# Patient Record
Sex: Female | Born: 1990 | Race: White | Hispanic: No | Marital: Single | Smoking: Never smoker
Health system: Southern US, Community
[De-identification: ages and names within clinical notes are randomized; demographics above are authoritative.]

## PROBLEM LIST (undated history)

## (undated) DIAGNOSIS — L74510 Primary focal hyperhidrosis, axilla: Secondary | ICD-10-CM

## (undated) DIAGNOSIS — T4145XA Adverse effect of unspecified anesthetic, initial encounter: Secondary | ICD-10-CM

## (undated) DIAGNOSIS — R0981 Nasal congestion: Secondary | ICD-10-CM

## (undated) DIAGNOSIS — F458 Other somatoform disorders: Secondary | ICD-10-CM

## (undated) DIAGNOSIS — S82851A Displaced trimalleolar fracture of right lower leg, initial encounter for closed fracture: Secondary | ICD-10-CM

## (undated) DIAGNOSIS — L709 Acne, unspecified: Secondary | ICD-10-CM

## (undated) DIAGNOSIS — F410 Panic disorder [episodic paroxysmal anxiety] without agoraphobia: Secondary | ICD-10-CM

## (undated) DIAGNOSIS — T8859XA Other complications of anesthesia, initial encounter: Secondary | ICD-10-CM

## (undated) DIAGNOSIS — M26629 Arthralgia of temporomandibular joint, unspecified side: Secondary | ICD-10-CM

## (undated) DIAGNOSIS — Z87828 Personal history of other (healed) physical injury and trauma: Secondary | ICD-10-CM

## (undated) DIAGNOSIS — S93431A Sprain of tibiofibular ligament of right ankle, initial encounter: Secondary | ICD-10-CM

## (undated) HISTORY — PX: FRACTURE SURGERY: SHX138

## (undated) HISTORY — PX: WISDOM TOOTH EXTRACTION: SHX21

---
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
## 2009-06-18 ENCOUNTER — Encounter: Admission: RE | Admit: 2009-06-18 | Discharge: 2009-06-18 | Payer: Self-pay | Admitting: Family Medicine

## 2013-11-07 DIAGNOSIS — S93431A Sprain of tibiofibular ligament of right ankle, initial encounter: Secondary | ICD-10-CM

## 2013-11-07 DIAGNOSIS — S82851A Displaced trimalleolar fracture of right lower leg, initial encounter for closed fracture: Secondary | ICD-10-CM

## 2013-11-07 HISTORY — DX: Displaced trimalleolar fracture of right lower leg, initial encounter for closed fracture: S82.851A

## 2013-11-07 HISTORY — DX: Sprain of tibiofibular ligament of right ankle, initial encounter: S93.431A

## 2013-11-14 ENCOUNTER — Encounter (HOSPITAL_BASED_OUTPATIENT_CLINIC_OR_DEPARTMENT_OTHER): Payer: Self-pay | Admitting: *Deleted

## 2013-11-14 DIAGNOSIS — R0981 Nasal congestion: Secondary | ICD-10-CM

## 2013-11-14 HISTORY — DX: Nasal congestion: R09.81

## 2013-11-16 ENCOUNTER — Other Ambulatory Visit: Payer: Self-pay | Admitting: Orthopedic Surgery

## 2013-11-16 NOTE — H&P (Signed)
[name] is an 22 y.o. female.   Chief Complaint: Right ankle pain HPI: Pt reports to surgery center for ORIF of right trimalleolar ankle fracture and syndesmosis disruption.  On 11/10/2013 pt slipped on wet curb and twisted right ankle causing pain and a loud popping sound.  Pt denies N/V/F/C, chest pain, SOB, paresthesia bilaterally.  Past Medical History  Diagnosis Date  . Panic attacks     about 1/month  . Complication of anesthesia     had panic attack while coming out of anesthesia with wisdom teeth extraction  . Hyperhidrosis of axilla   . Nasal congestion 11/14/2013  . History of back injury   . Trimalleolar fracture of right ankle 11/2013  . Syndesmotic disruption of right ankle 11/2013  . TMJ syndrome   . Grinding of teeth     wears mouth guard at night  . Acne     Past Surgical History  Procedure Laterality Date  . Wisdom tooth extraction      History reviewed. No pertinent family history. Social History:  reports that she has never smoked. She has never used smokeless tobacco. She reports that she drinks alcohol. She reports that she does not use illicit drugs.  Allergies:  Allergies  Allergen Reactions  . Novocain [Procaine] Swelling    SWELLING OF FACE - AS A CHILD    No prescriptions prior to admission    No results found for this or any previous visit (from the past 48 hour(s)). No results found.  Review of Systems  Constitutional: Negative.   HENT: Negative.   Eyes: Negative.   Respiratory: Negative.   Cardiovascular: Negative.   Gastrointestinal: Negative.   Musculoskeletal: Negative.   Skin: Negative.   Neurological: Negative for dizziness.  Endo/Heme/Allergies: Does not bruise/bleed easily.  Psychiatric/Behavioral: The patient is not nervous/anxious.     Height 5' 7.5" (1.715 m), weight 123.378 kg (272 lb), last menstrual period 11/06/2013. Physical Exam WD WN 22 y/o female, NAD, A/Ox3, appears stated age.  EOMI, mood and affect  normal, respirations unlabored. Edema laterally and medially, skin healthy and intact. DP pulses 2+ bilaterally. Sensation to light touch intact bilaterally.  Toes mobile and well perfused with cap refill <2sec.  No lymphadenopathy noted.  Assessment/Plan Right trimalleolar ankle fracture and syndesmosis disruption. Plan: ORIF right ankle fracture.  The risks and benefits of the alternative treatment options have been discussed in detail.  The patient wishes to proceed with surgery and specifically understands risks of bleeding, infection, nerve damage, blood clots, need for additional surgery, amputation and death.   FLOWERS, CHRISTOPHER S 11/16/2013, 5:31 PM  Agree with the above note.  Pt understands the plan and agrees.

## 2013-11-17 ENCOUNTER — Ambulatory Visit (HOSPITAL_COMMUNITY): Payer: 59

## 2013-11-17 ENCOUNTER — Encounter (HOSPITAL_BASED_OUTPATIENT_CLINIC_OR_DEPARTMENT_OTHER)
Admission: RE | Disposition: A | Discharge status: Home / Self Care | Payer: Self-pay | Source: Ambulatory Visit | Attending: Orthopedic Surgery

## 2013-11-17 ENCOUNTER — Encounter (HOSPITAL_BASED_OUTPATIENT_CLINIC_OR_DEPARTMENT_OTHER): Payer: Self-pay | Admitting: *Deleted

## 2013-11-17 ENCOUNTER — Ambulatory Visit (HOSPITAL_BASED_OUTPATIENT_CLINIC_OR_DEPARTMENT_OTHER): Payer: 59 | Admitting: Anesthesiology

## 2013-11-17 ENCOUNTER — Encounter (HOSPITAL_BASED_OUTPATIENT_CLINIC_OR_DEPARTMENT_OTHER): Payer: 59 | Admitting: Anesthesiology

## 2013-11-17 ENCOUNTER — Ambulatory Visit (HOSPITAL_BASED_OUTPATIENT_CLINIC_OR_DEPARTMENT_OTHER)
Admission: RE | Admit: 2013-11-17 | Discharge: 2013-11-17 | Disposition: A | Discharge status: Home / Self Care | Payer: 59 | Source: Ambulatory Visit | Attending: Orthopedic Surgery | Admitting: Orthopedic Surgery

## 2013-11-17 DIAGNOSIS — M26609 Unspecified temporomandibular joint disorder, unspecified side: Secondary | ICD-10-CM | POA: Insufficient documentation

## 2013-11-17 DIAGNOSIS — X500XXA Overexertion from strenuous movement or load, initial encounter: Secondary | ICD-10-CM | POA: Insufficient documentation

## 2013-11-17 DIAGNOSIS — Y92410 Unspecified street and highway as the place of occurrence of the external cause: Secondary | ICD-10-CM | POA: Insufficient documentation

## 2013-11-17 DIAGNOSIS — Y998 Other external cause status: Secondary | ICD-10-CM | POA: Insufficient documentation

## 2013-11-17 DIAGNOSIS — F410 Panic disorder [episodic paroxysmal anxiety] without agoraphobia: Secondary | ICD-10-CM | POA: Insufficient documentation

## 2013-11-17 DIAGNOSIS — S82853A Displaced trimalleolar fracture of unspecified lower leg, initial encounter for closed fracture: Secondary | ICD-10-CM | POA: Insufficient documentation

## 2013-11-17 DIAGNOSIS — L708 Other acne: Secondary | ICD-10-CM | POA: Insufficient documentation

## 2013-11-17 DIAGNOSIS — S82851A Displaced trimalleolar fracture of right lower leg, initial encounter for closed fracture: Secondary | ICD-10-CM

## 2013-11-17 HISTORY — DX: Panic disorder (episodic paroxysmal anxiety): F41.0

## 2013-11-17 HISTORY — PX: ORIF ANKLE FRACTURE: SHX5408

## 2013-11-17 HISTORY — DX: Nasal congestion: R09.81

## 2013-11-17 HISTORY — DX: Adverse effect of unspecified anesthetic, initial encounter: T41.45XA

## 2013-11-17 HISTORY — DX: Arthralgia of temporomandibular joint, unspecified side: M26.629

## 2013-11-17 HISTORY — DX: Personal history of other (healed) physical injury and trauma: Z87.828

## 2013-11-17 HISTORY — DX: Acne, unspecified: L70.9

## 2013-11-17 HISTORY — DX: Sprain of tibiofibular ligament of right ankle, initial encounter: S93.431A

## 2013-11-17 HISTORY — DX: Other somatoform disorders: F45.8

## 2013-11-17 HISTORY — DX: Displaced trimalleolar fracture of right lower leg, initial encounter for closed fracture: S82.851A

## 2013-11-17 HISTORY — DX: Primary focal hyperhidrosis, axilla: L74.510

## 2013-11-17 HISTORY — DX: Other complications of anesthesia, initial encounter: T88.59XA

## 2013-11-17 LAB — POCT HEMOGLOBIN-HEMACUE: Hemoglobin: 13.7 g/dL (ref 12.0–15.0)

## 2013-11-17 SURGERY — OPEN REDUCTION INTERNAL FIXATION (ORIF) ANKLE FRACTURE
Anesthesia: General | Site: Ankle | Laterality: Right

## 2013-11-17 MED ORDER — HYDROMORPHONE HCL PF 1 MG/ML IJ SOLN
INTRAMUSCULAR | Status: AC
  Filled 2013-11-17: qty 1

## 2013-11-17 MED ORDER — FENTANYL CITRATE 0.05 MG/ML IJ SOLN
INTRAMUSCULAR | Status: AC
  Filled 2013-11-17: qty 2

## 2013-11-17 MED ORDER — ACETAMINOPHEN 500 MG PO TABS
1000.0000 mg | ORAL_TABLET | Freq: Once | ORAL | Status: AC
  Administered 2013-11-17: 1000 mg via ORAL

## 2013-11-17 MED ORDER — 0.9 % SODIUM CHLORIDE (POUR BTL) OPTIME
TOPICAL | Status: DC | PRN
  Administered 2013-11-17: 300 mL

## 2013-11-17 MED ORDER — LACTATED RINGERS IV SOLN: INTRAVENOUS | Status: DC

## 2013-11-17 MED ORDER — MIDAZOLAM HCL 2 MG/2ML IJ SOLN
INTRAMUSCULAR | Status: AC
  Filled 2013-11-17: qty 2

## 2013-11-17 MED ORDER — BUPIVACAINE-EPINEPHRINE PF 0.5-1:200000 % IJ SOLN
INTRAMUSCULAR | Status: DC | PRN
  Administered 2013-11-17: 150 mg via PERINEURAL

## 2013-11-17 MED ORDER — ONDANSETRON HCL 4 MG/2ML IJ SOLN
INTRAMUSCULAR | Status: DC | PRN
  Administered 2013-11-17: 4 mg via INTRAVENOUS

## 2013-11-17 MED ORDER — OXYCODONE HCL 5 MG PO TABS
5.0000 mg | ORAL_TABLET | Freq: Once | ORAL | Status: DC | PRN
Start: 2013-11-17 — End: 2013-11-17

## 2013-11-17 MED ORDER — PROPOFOL 10 MG/ML IV BOLUS
INTRAVENOUS | Status: DC | PRN
  Administered 2013-11-17: 200 mg via INTRAVENOUS

## 2013-11-17 MED ORDER — DOCUSATE SODIUM 100 MG PO CAPS: 100.0000 mg | ORAL_CAPSULE | Freq: Two times a day (BID) | ORAL | Status: DC

## 2013-11-17 MED ORDER — ACETAMINOPHEN 500 MG PO TABS
ORAL_TABLET | ORAL | Status: AC
  Filled 2013-11-17: qty 2

## 2013-11-17 MED ORDER — OXYCODONE HCL 5 MG/5ML PO SOLN: 5.0000 mg | Freq: Once | ORAL | Status: DC | PRN

## 2013-11-17 MED ORDER — LACTATED RINGERS IV SOLN
INTRAVENOUS | Status: DC
  Administered 2013-11-17 (×2): via INTRAVENOUS

## 2013-11-17 MED ORDER — BUPIVACAINE-EPINEPHRINE 0.5% -1:200000 IJ SOLN
INTRAMUSCULAR | Status: DC | PRN
  Administered 2013-11-17: 30 mL

## 2013-11-17 MED ORDER — FENTANYL CITRATE 0.05 MG/ML IJ SOLN
50.0000 ug | INTRAMUSCULAR | Status: DC | PRN
  Administered 2013-11-17: 100 ug via INTRAVENOUS

## 2013-11-17 MED ORDER — LIDOCAINE HCL (CARDIAC) 20 MG/ML IV SOLN
INTRAVENOUS | Status: DC | PRN
  Administered 2013-11-17: 30 mg via INTRAVENOUS

## 2013-11-17 MED ORDER — MIDAZOLAM HCL 2 MG/2ML IJ SOLN
1.0000 mg | INTRAMUSCULAR | Status: DC | PRN
  Administered 2013-11-17: 4 mg via INTRAVENOUS

## 2013-11-17 MED ORDER — HYDROMORPHONE HCL PF 1 MG/ML IJ SOLN
0.2500 mg | INTRAMUSCULAR | Status: DC | PRN
  Administered 2013-11-17 (×3): 0.5 mg via INTRAVENOUS

## 2013-11-17 MED ORDER — CEFAZOLIN SODIUM-DEXTROSE 2-3 GM-% IV SOLR
INTRAVENOUS | Status: AC
  Filled 2013-11-17: qty 50

## 2013-11-17 MED ORDER — DEXAMETHASONE SODIUM PHOSPHATE 10 MG/ML IJ SOLN
INTRAMUSCULAR | Status: DC | PRN
  Administered 2013-11-17: 6 mg
  Administered 2013-11-17: 10 mg via INTRAVENOUS

## 2013-11-17 MED ORDER — BUPIVACAINE-EPINEPHRINE PF 0.5-1:200000 % IJ SOLN
INTRAMUSCULAR | Status: AC
  Filled 2013-11-17: qty 30

## 2013-11-17 MED ORDER — FENTANYL CITRATE 0.05 MG/ML IJ SOLN
INTRAMUSCULAR | Status: DC | PRN
  Administered 2013-11-17 (×3): 50 ug via INTRAVENOUS

## 2013-11-17 MED ORDER — BACITRACIN ZINC 500 UNIT/GM EX OINT
TOPICAL_OINTMENT | CUTANEOUS | Status: DC | PRN
  Administered 2013-11-17: 1 via TOPICAL

## 2013-11-17 MED ORDER — MIDAZOLAM HCL 2 MG/ML PO SYRP: 12.0000 mg | ORAL_SOLUTION | Freq: Once | ORAL | Status: DC | PRN

## 2013-11-17 MED ORDER — MIDAZOLAM HCL 5 MG/5ML IJ SOLN
INTRAMUSCULAR | Status: DC | PRN
  Administered 2013-11-17: 2 mg via INTRAVENOUS

## 2013-11-17 MED ORDER — FENTANYL CITRATE 0.05 MG/ML IJ SOLN
INTRAMUSCULAR | Status: AC
  Filled 2013-11-17: qty 6

## 2013-11-17 MED ORDER — SENNOSIDES 8.6 MG PO TABS: 2.0000 | ORAL_TABLET | Freq: Every day | ORAL | Status: DC

## 2013-11-17 MED ORDER — PROMETHAZINE HCL 25 MG/ML IJ SOLN: 6.2500 mg | INTRAMUSCULAR | Status: DC | PRN

## 2013-11-17 MED ORDER — RIVAROXABAN 10 MG PO TABS: 10.0000 mg | ORAL_TABLET | Freq: Every day | ORAL | Status: DC

## 2013-11-17 MED ORDER — OXYCODONE HCL 5 MG PO TABS: 5.0000 mg | ORAL_TABLET | ORAL | Status: DC | PRN

## 2013-11-17 MED ORDER — CHLORHEXIDINE GLUCONATE 4 % EX LIQD: 60.0000 mL | Freq: Once | CUTANEOUS | Status: DC

## 2013-11-17 MED ORDER — CEFAZOLIN SODIUM-DEXTROSE 2-3 GM-% IV SOLR
2.0000 g | INTRAVENOUS | Status: AC
  Administered 2013-11-17: 2 g via INTRAVENOUS

## 2013-11-17 MED ORDER — SODIUM CHLORIDE 0.9 % IV SOLN: INTRAVENOUS | Status: DC

## 2013-11-17 SURGICAL SUPPLY — 90 items
BANDAGE ESMARK 6X9 LF (GAUZE/BANDAGES/DRESSINGS) ×1 IMPLANT
BIT DRILL 2.0 (BIT) ×2
BIT DRILL 2.5X2.75 QC CALB (BIT) ×1 IMPLANT
BIT DRILL 2XNS DISP SS SM FRAG (BIT) IMPLANT
BIT DRILL 3.5X5.5 QC CALB (BIT) ×1 IMPLANT
BIT DRILL CALIBRATED 2.7 (BIT) ×1 IMPLANT
BIT DRL 2XNS DISP SS SM FRAG (BIT) ×1
BLADE SURG 15 STRL LF DISP TIS (BLADE) ×2 IMPLANT
BLADE SURG 15 STRL SS (BLADE) ×4
BNDG CMPR 9X4 STRL LF SNTH (GAUZE/BANDAGES/DRESSINGS)
BNDG CMPR 9X6 STRL LF SNTH (GAUZE/BANDAGES/DRESSINGS) ×1
BNDG COHESIVE 4X5 TAN STRL (GAUZE/BANDAGES/DRESSINGS) ×2 IMPLANT
BNDG COHESIVE 6X5 TAN STRL LF (GAUZE/BANDAGES/DRESSINGS) ×2 IMPLANT
BNDG ESMARK 4X9 LF (GAUZE/BANDAGES/DRESSINGS) IMPLANT
BNDG ESMARK 6X9 LF (GAUZE/BANDAGES/DRESSINGS) ×2
CANISTER SUCT 1200ML W/VALVE (MISCELLANEOUS) ×2 IMPLANT
CHLORAPREP W/TINT 26ML (MISCELLANEOUS) ×2 IMPLANT
COVER TABLE BACK 60X90 (DRAPES) ×2 IMPLANT
CUFF TOURNIQUET SINGLE 34IN LL (TOURNIQUET CUFF) ×1 IMPLANT
CUFF TOURNIQUET SINGLE 44IN (TOURNIQUET CUFF) ×1 IMPLANT
DECANTER SPIKE VIAL GLASS SM (MISCELLANEOUS) IMPLANT
DRAPE C-ARM 42X72 X-RAY (DRAPES) ×2 IMPLANT
DRAPE C-ARMOR (DRAPES) ×1 IMPLANT
DRAPE EXTREMITY T 121X128X90 (DRAPE) ×2 IMPLANT
DRAPE U-SHAPE 47X51 STRL (DRAPES) ×2 IMPLANT
DRSG ADAPTIC 3X8 NADH LF (GAUZE/BANDAGES/DRESSINGS) ×1 IMPLANT
DRSG EMULSION OIL 3X3 NADH (GAUZE/BANDAGES/DRESSINGS) ×1 IMPLANT
ELECT REM PT RETURN 9FT ADLT (ELECTROSURGICAL) ×2
ELECTRODE REM PT RTRN 9FT ADLT (ELECTROSURGICAL) ×1 IMPLANT
GLOVE BIO SURGEON STRL SZ8 (GLOVE) ×3 IMPLANT
GLOVE BIOGEL PI IND STRL 7.0 (GLOVE) IMPLANT
GLOVE BIOGEL PI IND STRL 7.5 (GLOVE) ×1 IMPLANT
GLOVE BIOGEL PI IND STRL 8 (GLOVE) ×1 IMPLANT
GLOVE BIOGEL PI INDICATOR 7.0 (GLOVE) ×1
GLOVE BIOGEL PI INDICATOR 7.5 (GLOVE) ×1
GLOVE BIOGEL PI INDICATOR 8 (GLOVE) ×1
GLOVE ECLIPSE 6.5 STRL STRAW (GLOVE) ×1 IMPLANT
GLOVE ECLIPSE 7.0 STRL STRAW (GLOVE) ×2 IMPLANT
GLOVE EXAM NITRILE MD LF STRL (GLOVE) ×1 IMPLANT
GOWN PREVENTION PLUS XLARGE (GOWN DISPOSABLE) ×3 IMPLANT
GOWN PREVENTION PLUS XXLARGE (GOWN DISPOSABLE) ×2 IMPLANT
K-WIRE ACE 1.6X6 (WIRE) ×4
KWIRE ACE 1.6X6 (WIRE) IMPLANT
NEEDLE HYPO 22GX1.5 SAFETY (NEEDLE) IMPLANT
NS IRRIG 1000ML POUR BTL (IV SOLUTION) ×2 IMPLANT
PACK BASIN DAY SURGERY FS (CUSTOM PROCEDURE TRAY) ×2 IMPLANT
PAD ABD 8X10 STRL (GAUZE/BANDAGES/DRESSINGS) ×4 IMPLANT
PAD CAST 4YDX4 CTTN HI CHSV (CAST SUPPLIES) ×1 IMPLANT
PADDING CAST ABS 4INX4YD NS (CAST SUPPLIES)
PADDING CAST ABS COTTON 4X4 ST (CAST SUPPLIES) IMPLANT
PADDING CAST COTTON 4X4 STRL (CAST SUPPLIES) ×2
PADDING CAST COTTON 6X4 STRL (CAST SUPPLIES) ×2 IMPLANT
PENCIL BUTTON HOLSTER BLD 10FT (ELECTRODE) ×2 IMPLANT
PLATE FIBULAR COMP LOCK 10H (Plate) ×1 IMPLANT
SANITIZER HAND PURELL 535ML FO (MISCELLANEOUS) ×2 IMPLANT
SCREW ACE CAN 4.0 44M (Screw) ×2 IMPLANT
SCREW CORTICAL 2.7 MM 22MM (Screw) ×1 IMPLANT
SCREW CORTICAL 2.7MM  20MM (Screw) ×1 IMPLANT
SCREW CORTICAL 2.7MM 20MM (Screw) IMPLANT
SCREW CORTICAL 3.5MM  20MM (Screw) ×1 IMPLANT
SCREW CORTICAL 3.5MM 20MM (Screw) IMPLANT
SCREW LOCK CANC STAR 4X10 (Screw) ×1 IMPLANT
SCREW LOCK CANC STAR 4X14 (Screw) ×1 IMPLANT
SCREW LOCK CANC STAR 4X18 (Screw) ×1 IMPLANT
SCREW LOCK CORT STAR 3.5X12 (Screw) ×1 IMPLANT
SCREW NON LOCKING LP 3.5 14MM (Screw) ×1 IMPLANT
SCREW NON LOCKING LP 3.5 16MM (Screw) ×1 IMPLANT
SHEET MEDIUM DRAPE 40X70 STRL (DRAPES) ×3 IMPLANT
SLEEVE SCD COMPRESS KNEE MED (MISCELLANEOUS) ×2 IMPLANT
SPLINT FAST PLASTER 5X30 (CAST SUPPLIES) ×20
SPLINT PLASTER CAST FAST 5X30 (CAST SUPPLIES) ×20 IMPLANT
SPONGE GAUZE 4X4 12PLY (GAUZE/BANDAGES/DRESSINGS) ×2 IMPLANT
SPONGE LAP 18X18 X RAY DECT (DISPOSABLE) ×2 IMPLANT
STOCKINETTE 6  STRL (DRAPES) ×1
STOCKINETTE 6 STRL (DRAPES) ×1 IMPLANT
SUCTION FRAZIER TIP 10 FR DISP (SUCTIONS) ×2 IMPLANT
SUT ETHILON 3 0 PS 1 (SUTURE) ×2 IMPLANT
SUT FIBERWIRE #2 38 T-5 BLUE (SUTURE)
SUT MNCRL AB 3-0 PS2 18 (SUTURE) ×4 IMPLANT
SUT VIC AB 0 SH 27 (SUTURE) IMPLANT
SUT VIC AB 2-0 SH 27 (SUTURE) ×2
SUT VIC AB 2-0 SH 27XBRD (SUTURE) IMPLANT
SUT VICRYL 4-0 PS2 18IN ABS (SUTURE) IMPLANT
SUTURE FIBERWR #2 38 T-5 BLUE (SUTURE) IMPLANT
SYR BULB 3OZ (MISCELLANEOUS) ×2 IMPLANT
SYR CONTROL 10ML LL (SYRINGE) ×1 IMPLANT
TOWEL OR 17X24 6PK STRL BLUE (TOWEL DISPOSABLE) ×3 IMPLANT
TOWEL OR NON WOVEN STRL DISP B (DISPOSABLE) ×1 IMPLANT
TUBE CONNECTING 20X1/4 (TUBING) ×1 IMPLANT
UNDERPAD 30X30 INCONTINENT (UNDERPADS AND DIAPERS) ×2 IMPLANT

## 2013-11-17 NOTE — Anesthesia Postprocedure Evaluation (Signed)
Anesthesia Post Note  Patient: [name]  Procedure(s) Performed: Procedure(s) (LRB): OPEN REDUCTION INTERNAL FIXATION (ORIF) RIGHT ANKLE TRIMALLEOLAR FRACTURE AND SYNDESMOSIS (Right)  Anesthesia type: General  Patient location: PACU  Post pain: Pain level controlled  Post assessment: Patient's Cardiovascular Status Stable  Last Vitals:  Filed Vitals:   11/17/13 1630  BP: 146/92  Pulse: 96  Temp:   Resp: 12    Post vital signs: Reviewed and stable  Level of consciousness: alert  Complications: No apparent anesthesia complications

## 2013-11-17 NOTE — Anesthesia Preprocedure Evaluation (Signed)
Anesthesia Evaluation  Patient identified by MRN, date of birth, ID band Patient awake    Reviewed: Allergy & Precautions, H&P , NPO status , Patient's Chart, lab work & pertinent test results, reviewed documented beta blocker date and time , Unable to perform ROS - Chart review only  History of Anesthesia Complications (+) Emergence Delirium  Airway Mallampati: II TM Distance: >3 FB Neck ROM: full    Dental  (+) Teeth Intact and Dental Advidsory Given   Pulmonary neg pulmonary ROS,  breath sounds clear to auscultation        Cardiovascular negative cardio ROS  Rhythm:regular Rate:Normal     Neuro/Psych negative neurological ROS  negative psych ROS   GI/Hepatic negative GI ROS, Neg liver ROS,   Endo/Other  Morbid obesity  Renal/GU negative Renal ROS     Musculoskeletal   Abdominal   Peds  Hematology negative hematology ROS (+)   Anesthesia Other Findings   Reproductive/Obstetrics negative OB ROS                           Anesthesia Physical Anesthesia Plan  ASA: II  Anesthesia Plan: General LMA   Post-op Pain Management: MAC Combined w/ Regional for Post-op pain   Induction:   Airway Management Planned:   Additional Equipment:   Intra-op Plan:   Post-operative Plan:   Informed Consent: I have reviewed the patients History and Physical, chart, labs and discussed the procedure including the risks, benefits and alternatives for the proposed anesthesia with the patient or authorized representative who has indicated his/her understanding and acceptance.   Dental Advisory Given  Plan Discussed with: Anesthesiologist, CRNA and Surgeon  Anesthesia Plan Comments:         Anesthesia Quick Evaluation

## 2013-11-17 NOTE — Anesthesia Procedure Notes (Signed)
Anesthesia Regional Block:  Popliteal block  Pre-Anesthetic Checklist: ,, timeout performed, Correct Patient, Correct Site, Correct Laterality, Correct Procedure, Correct Position, site marked, Risks and benefits discussed,  Surgical consent,  Pre-op evaluation,  At surgeon's request and post-op pain management  Laterality: Right  Prep: chloraprep       Needles:  Injection technique: Single-shot  Needle Type: Echogenic Stimulator Needle          Additional Needles:  Procedures: ultrasound guided (picture in chart) and nerve stimulator Popliteal block  Nerve Stimulator or Paresthesia:  Response: plantar flexion, 0.45 mA,   Additional Responses:   Narrative:  Start time: 11/17/2013 1:26 PM End time: 11/17/2013 1:36 PM Injection made incrementally with aspirations every 5 mL.  Performed by: Personally  Anesthesiologist: J. Adonis Huguenin, MD  Additional Notes: A functioning IV was confirmed and monitors were applied.  Sterile prep and drape, hand hygiene and sterile gloves were used.  Negative aspiration and test dose prior to incremental administration of local anesthetic. The patient tolerated the procedure well.Ultrasound  guidance: relevant anatomy identified, needle position confirmed, local anesthetic spread visualized around nerve(s), vascular puncture avoided.  Image printed for medical record.

## 2013-11-17 NOTE — Progress Notes (Signed)
Assisted Dr. Singer with right, ultrasound guided, popliteal/saphenous block. Side rails up, monitors on throughout procedure. See vital signs in flow sheet. Tolerated Procedure well. 

## 2013-11-17 NOTE — Transfer of Care (Signed)
Immediate Anesthesia Transfer of Care Note  Patient: [name]  Procedure(s) Performed: Procedure(s): OPEN REDUCTION INTERNAL FIXATION (ORIF) RIGHT ANKLE TRIMALLEOLAR FRACTURE AND SYNDESMOSIS (Right)  Patient Location: PACU  Anesthesia Type:GA combined with regional for post-op pain  Level of Consciousness: awake and patient cooperative  Airway & Oxygen Therapy: Patient Spontanous Breathing and Patient connected to face mask oxygen  Post-op Assessment: Report given to PACU RN and Post -op Vital signs reviewed and stable  Post vital signs: Reviewed and stable  Complications: No apparent anesthesia complications

## 2013-11-17 NOTE — Brief Op Note (Signed)
11/17/2013  3:51 PM  PATIENT:  [name]  22 y.o. female  PRE-OPERATIVE DIAGNOSIS:  Right ankle trimalleolar fracture syndesmosis disruption  POST-OPERATIVE DIAGNOSIS:  Right ankle trimalleolar fracture with stable syndesmosis  Procedure(s): 1.  ORIF right ankle trimalleolar fracture without fixation of posterior lip 2.  Stress exam of right ankle under fluoro  SURGEON:  Toni Arthurs, MD  ASSISTANT: n/a  ANESTHESIA:   General, regional  EBL:  minimal   TOURNIQUET:   Total Tourniquet Time Documented: Thigh (Right) - 13 minutes Total: Thigh (Right) - 13 minutes   COMPLICATIONS:  None apparent  DISPOSITION:  Extubated, awake and stable to recovery.  DICTATION ID:  [id number]

## 2013-11-18 ENCOUNTER — Encounter (HOSPITAL_BASED_OUTPATIENT_CLINIC_OR_DEPARTMENT_OTHER): Payer: Self-pay | Admitting: Orthopedic Surgery

## 2013-11-18 NOTE — Op Note (Signed)
[name], [name]             ACCOUNT NO.:  [id number]  MEDICAL RECORD NO.:  [id number]  LOCATION:                                 FACILITY:  PHYSICIAN:  Toni Arthurs, MD             DATE OF BIRTH:  DATE OF PROCEDURE:  11/17/2013 DATE OF DISCHARGE:                              OPERATIVE REPORT   PREOPERATIVE DIAGNOSIS:  Right ankle trimalleolar fracture with syndesmosis disruption.  POSTOPERATIVE DIAGNOSIS:  Right ankle trimalleolar fracture with stable syndesmosis.  PROCEDURE: 1. Open reduction, internal fixation of right ankle trimalleolar     fracture without fixation of posterior lip. 2. Stress examination of right ankle under fluoroscopy.  SURGEON:  Toni Arthurs, MD.  ANESTHESIA:  General, regional.  ESTIMATED BLOOD LOSS:  Minimal.  TOURNIQUET TIME:  13 minutes at 220 mmHg.  COMPLICATIONS:  None apparent.  DISPOSITION:  Extubated, awake and stable to recovery.  INDICATIONS FOR PROCEDURE:  The patient is a 22 year old woman who fell last week on some wet leaves twisting her right ankle.  She was seen in the emergency department in [place], [place] where x-rays revealed a trimalleolar ankle fracture.  She returned home and presents now for operative treatment of this injury.  She understands the risks and benefits, the alternative treatment options and elects surgical treatment.  She specifically understands risks of bleeding, infection, nerve damage, blood clots, need for additional surgery, amputation, and death.  PROCEDURE IN DETAIL:  After preoperative consent was obtained and the correct operative site was identified, the patient was brought to the operating room and placed supine on the operating table.  General anesthesia was induced.  Preoperative antibiotics were administered. Surgical time-out was taken.  The right lower extremity was prepped and draped in standard sterile fashion with the tourniquet around the thigh. The extremity was  exsanguinated, and the tourniquet was inflated to 250 mmHg.  A longitudinal incision was made over the lateral malleolus. Sharp dissection was carried down through the skin and subcutaneous tissue.  The comminuted fracture of the lateral malleolus was identified.  It was cleaned of all hematoma.  There was a large butterfly fragment posteriorly.  This was reduced to the proximal fragment and held with a tenaculum.  The distal fragment was then reduced to the 2 proximal fragments and again secured with a second tenaculum.  AP and lateral fluoroscopic images confirmed appropriate reduction of the fibular fracture.  Two 2.7-mm fully-threaded lag screws were inserted from anterior to posterior, securing the butterfly fragment in place.  A 3.5-mm fully-threaded lag screw was inserted from posterior to anterior securing the distal fragment to the proximal 2 fragments.  AP and lateral fluoroscopic images confirmed appropriate position and length of all 3 lag screws.  At this point, a composite plate was selected from the [employer] fibula plate set.  This was a 10 hole plate that was contoured to fit the lateral malleolus.  It was secured provisionally with K-wires.  AP and lateral fluoroscopic images confirmed appropriate position of the plate.  The plate was then secured distally with a nonlocking 3.5-mm fully-threaded screw pulling the plate securely down to the fibula.  Two  more locking screws were inserted and the nonlocking screw was removed and replaced with a locking screw. These were 4-mm fully-threaded cancellous screws.  Proximally, the oblong hole was drilled and a 3.5-mm fully-threaded screw was inserted pulling the plate securely to the bone.  Another nonlocking screw was placed and the most proximal hole used on the plate.  In between the locking screw was inserted again in bicortical fashion.  AP and lateral fluoroscopic images confirmed appropriate position and length of  all hardware and appropriate reduction of the fracture.  Attention was then turned to the medial malleolus where a curvilinear incision was made over the fracture site.  Sharp dissection was carried down through the skin and subcutaneous tissue.  The fracture site was opened and cleaned of all hematoma.  The fracture was reduced and held provisionally with a tenaculum.  AP and lateral fluoroscopic images confirmed appropriate position of the fracture.  Two K-wires were then inserted from the tip of the medial malleolus in the metaphyseal bone of the distal tibia.  The appropriate position was confirmed on AP and lateral images.  Two 44-mm partially threaded screws were then inserted and noted to have excellent purchase.  The K-wires were removed.  A stress examination was then performed under fluoroscopic control.  A mortise view was obtained.  Dorsiflexion and external rotation stress was applied with the forefoot held in a supinated position.  These showed a stable syndesmosis with no widening of the medial clear space or the ankle mortise.  Both wounds were irrigated copiously and closed with inverted simple sutures of 3-0 Monocryl and running 3-0 nylon sutures.  The posterior malleolus fracture fragment was quite small and reduced appropriately with reduction of the fibula.  The tourniquet had been released at 13 minutes since it was not working particularly well. Hemostasis was achieved prior to closure.  Both wounds were infiltrated with 0.5% Marcaine with epinephrine for postoperative pain control. Sterile dressings were applied followed by a well-padded short-leg splint.  The patient was awakened by Anesthesia and transported to the recovery room in stable condition.  FOLLOWUP PLAN:  The patient will be nonweightbearing on the right lower extremity.  She will follow up with me in 2 weeks.  She will be observed overnight for pain control.     Toni Arthurs, MD     JH/MEDQ   D:  11/17/2013  T:  11/18/2013  Job:  [id number]

## 2014-08-04 ENCOUNTER — Ambulatory Visit (INDEPENDENT_AMBULATORY_CARE_PROVIDER_SITE_OTHER): Payer: 59 | Admitting: Family Medicine

## 2014-08-04 VITALS — BP 110/82 | HR 80 | Temp 99.6°F | Resp 16 | Ht 66.0 in | Wt 273.0 lb

## 2014-08-04 DIAGNOSIS — J0100 Acute maxillary sinusitis, unspecified: Secondary | ICD-10-CM

## 2014-08-04 DIAGNOSIS — J069 Acute upper respiratory infection, unspecified: Secondary | ICD-10-CM

## 2014-08-04 MED ORDER — IPRATROPIUM BROMIDE 0.03 % NA SOLN: 2.0000 | Freq: Two times a day (BID) | NASAL | Status: AC

## 2014-08-04 MED ORDER — FLUTICASONE PROPIONATE 50 MCG/ACT NA SUSP: 2.0000 | Freq: Every day | NASAL | Status: AC

## 2014-08-04 MED ORDER — AMOXICILLIN 500 MG PO CAPS: 1000.0000 mg | ORAL_CAPSULE | Freq: Two times a day (BID) | ORAL | Status: AC

## 2014-08-04 NOTE — Patient Instructions (Signed)

## 2014-08-04 NOTE — Progress Notes (Signed)
Subjective:    Patient ID: [name], female    DOB: [birth year], 23 y.o.   MRN: [id number]  08/04/2014  Sinusitis   HPI This 23 y.o. female presents for evaluation of sinus congestion.  Onset four to five days ago.  No fever; no chills or sweats.  Mild headache; taking Advil Cold & Sinus.  No ear pain but +sore throat severe; dry.  +pain with swallowing until two days ago. . +rhinorrhea; +PND. +nasal congestion.  +coughing dry secondary to cough.  No SOB.  No sputum.  No v/d.  No tobacco.  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  PCP:  Ruthell Rummage in [place].     Review of Systems  Constitutional: Negative for fever, chills, diaphoresis and fatigue.  HENT: Positive for congestion, postnasal drip, rhinorrhea, sinus pressure, sore throat, trouble swallowing and voice change. Negative for ear pain and sneezing.   Respiratory: Positive for cough. Negative for shortness of breath, wheezing and stridor.   Gastrointestinal: Negative for nausea, vomiting and diarrhea.  Skin: Negative for rash.  Neurological: Positive for headaches.    Past Medical History  Diagnosis Date  . Panic attacks     about 1/month  . Complication of anesthesia     had panic attack while coming out of anesthesia with wisdom teeth extraction  . Hyperhidrosis of axilla   . Nasal congestion 11/14/2013  . History of back injury   . Trimalleolar fracture of right ankle 11/2013  . Syndesmotic disruption of right ankle 11/2013  . TMJ syndrome   . Grinding of teeth     wears mouth guard at night  . Acne    Past Surgical History  Procedure Laterality Date  . Wisdom tooth extraction    . Orif ankle fracture Right 11/17/2013    Procedure: OPEN REDUCTION INTERNAL FIXATION (ORIF) RIGHT ANKLE TRIMALLEOLAR FRACTURE AND SYNDESMOSIS;  Surgeon: Toni Arthurs, MD;  Location: [facility];  Service: Orthopedics;  Laterality: Right;  . Fracture surgery     Allergies  Allergen Reactions  . Novocain [Procaine]  Swelling    SWELLING OF FACE - AS A CHILD   Current Outpatient Prescriptions  Medication Sig Dispense Refill  . aluminum chloride (DRYSOL) 20 % external solution Apply topically at bedtime.      Marland Kitchen amoxicillin (AMOXIL) 500 MG capsule Take 2 capsules (1,000 mg total) by mouth 2 (two) times daily.  40 capsule  0  . clindamycin-benzoyl peroxide (BENZACLIN) gel Apply topically 2 (two) times daily.      . fluticasone (FLONASE) 50 MCG/ACT nasal spray Place 2 sprays into both nostrils daily.  16 g  6  . ipratropium (ATROVENT) 0.03 % nasal spray Place 2 sprays into the nose 2 (two) times daily.  30 mL  0  . tazarotene (AVAGE) 0.1 % cream Apply topically at bedtime.       No current facility-administered medications for this visit.   History   Social History  . Marital Status: Single    Spouse Name: N/A    Number of Children: N/A  . Years of Education: N/A   Occupational History  . Not on file.   Social History Main Topics  . Smoking status: Never Smoker   . Smokeless tobacco: Never Used  . Alcohol Use: Yes     Comment: 2 x/month  . Drug Use: No  . Sexual Activity: Not on file   Other Topics Concern  . Not on file   Social History Narrative  .  No narrative on file        Objective:    BP 110/82  Pulse 80  Temp(Src) 99.6 F (37.6 C)  Resp 16  Ht  (1.676 m)  Wt 273 lb (123.832 kg)  BMI 44.08 kg/m2  SpO2 99%  LMP 07/21/2014 Physical Exam  Nursing note and vitals reviewed. Constitutional: She is oriented to person, place, and time. She appears well-developed and well-nourished. No distress.  HENT:  Head: Normocephalic and atraumatic.  Right Ear: External ear normal.  Left Ear: External ear normal.  Nose: Nose normal.  Mouth/Throat: Posterior oropharyngeal erythema present. No oropharyngeal exudate, posterior oropharyngeal edema or tonsillar abscesses.  Eyes: Conjunctivae are normal. Pupils are equal, round, and reactive to light.  Neck: Normal range of motion.  Neck supple.  Cardiovascular: Normal rate, regular rhythm and normal heart sounds.  Exam reveals no gallop and no friction rub.   No murmur heard. Pulmonary/Chest: Effort normal and breath sounds normal. She has no wheezes. She has no rales.  Neurological: She is alert and oriented to person, place, and time.  Skin: No rash noted. She is not diaphoretic.  Psychiatric: She has a normal mood and affect. Her behavior is normal.        Assessment & Plan:   1. Acute upper respiratory infections of unspecified site   2. Acute maxillary sinusitis, recurrence not specified    1. URI:  New.  Continue Advil Cold & Sinus.  Rx for Atrovent nasal spray and Flonase provided. 2.  Acute maxillary sinusitis: New. Early. Recommend Atrovent nasal spray and Flonase for the next 3-5 days. If no improvement in five days, start Amoxicillin.  Meds ordered this encounter  Medications  . ipratropium (ATROVENT) 0.03 % nasal spray    Sig: Place 2 sprays into the nose 2 (two) times daily.    Dispense:  30 mL    Refill:  0  . fluticasone (FLONASE) 50 MCG/ACT nasal spray    Sig: Place 2 sprays into both nostrils daily.    Dispense:  16 g    Refill:  6  . amoxicillin (AMOXIL) 500 MG capsule    Sig: Take 2 capsules (1,000 mg total) by mouth 2 (two) times daily.    Dispense:  40 capsule    Refill:  0    No Follow-up on file.  Nilda Simmer, M.D.  [facility] [address] [place], [place]  [address] [phone] phone [phone] fax

## 2014-09-02 IMAGING — RF DG C-ARM 61-120 MIN
1 series · 4 of 4 positions shown · non-contrast
Comparison: None available.

FLUOROSCOPY TIME:  36 seconds.

CLINICAL DATA: Trimalleolar ankle fracture.

EXAM:
DG C-ARM 1-60 MIN
TECHNIQUE: Four intraoperative C-arm views submitted for review after surgery.

[Series 1: run · 4 of 4 slices shown]
[im 1/4]
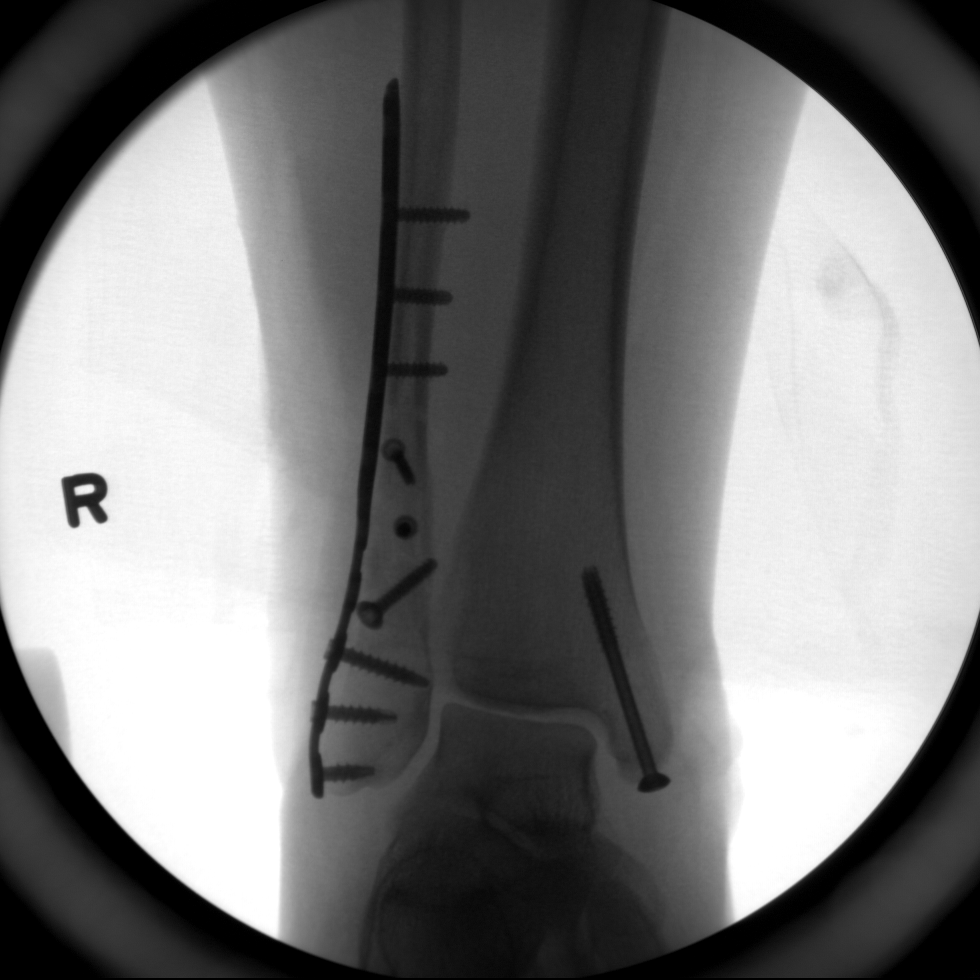
[im 2/4]
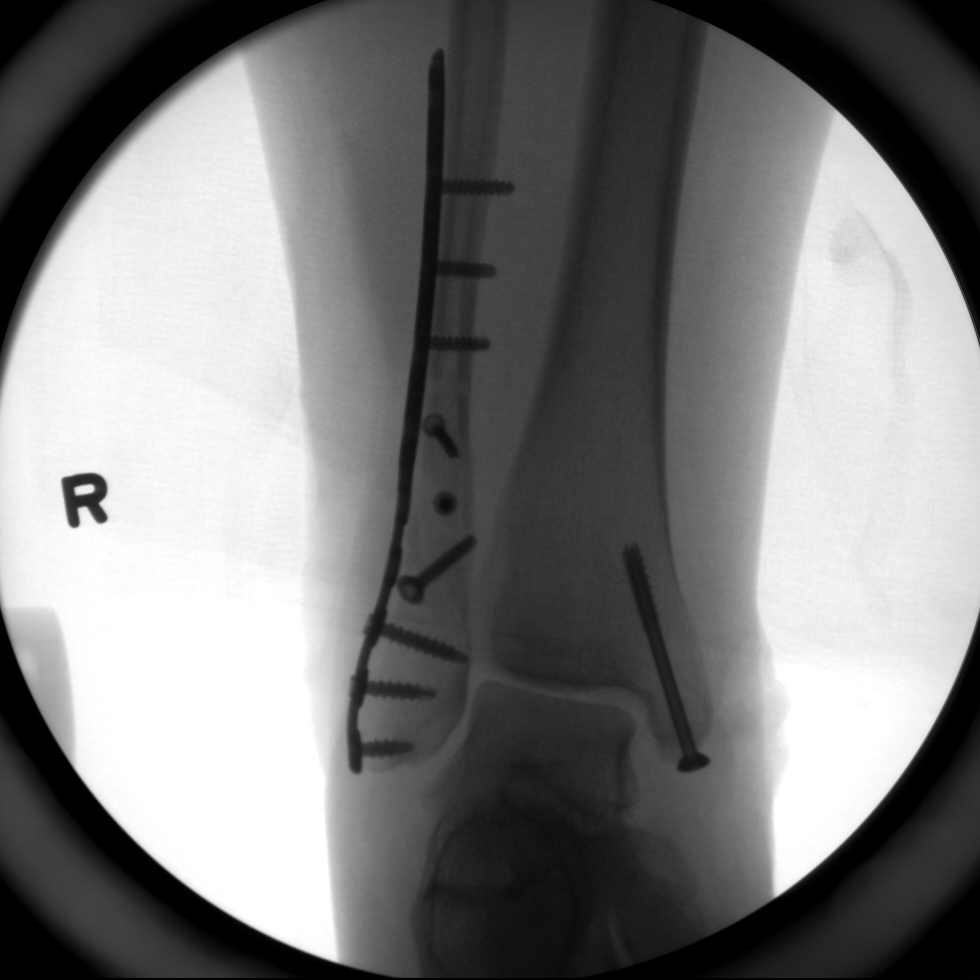
[im 3/4]
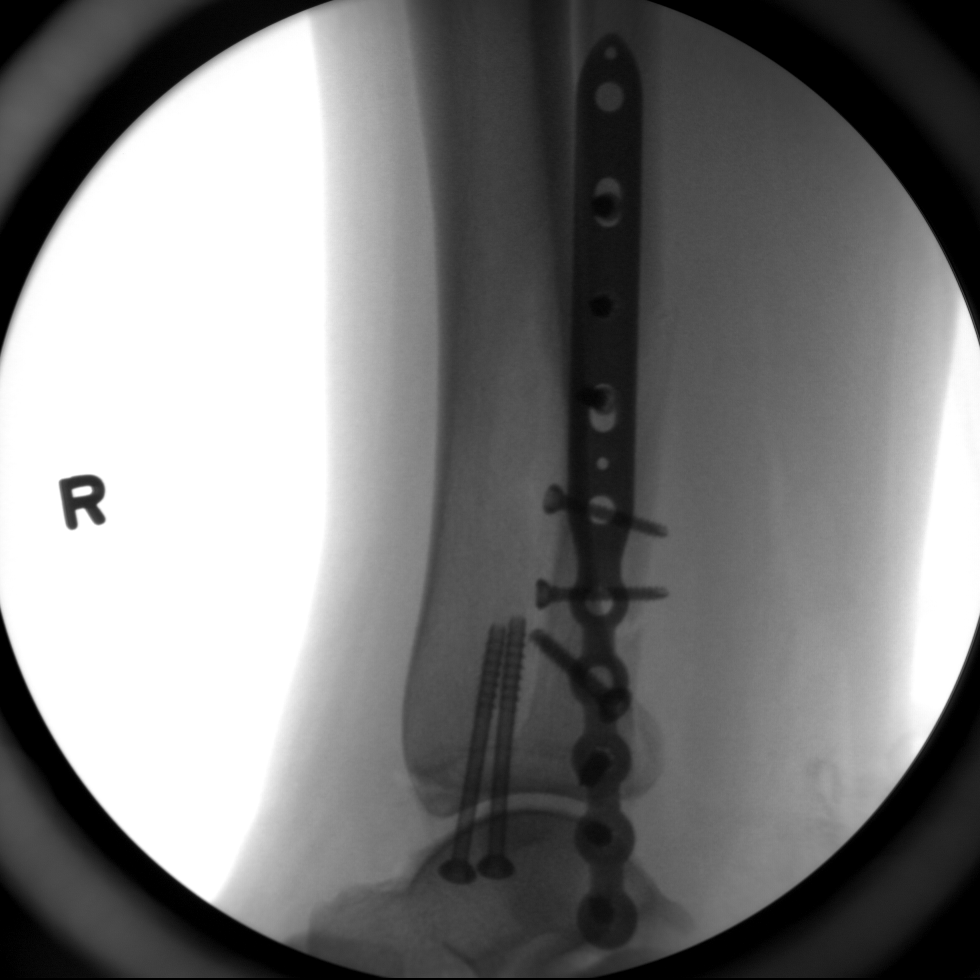
[im 4/4]
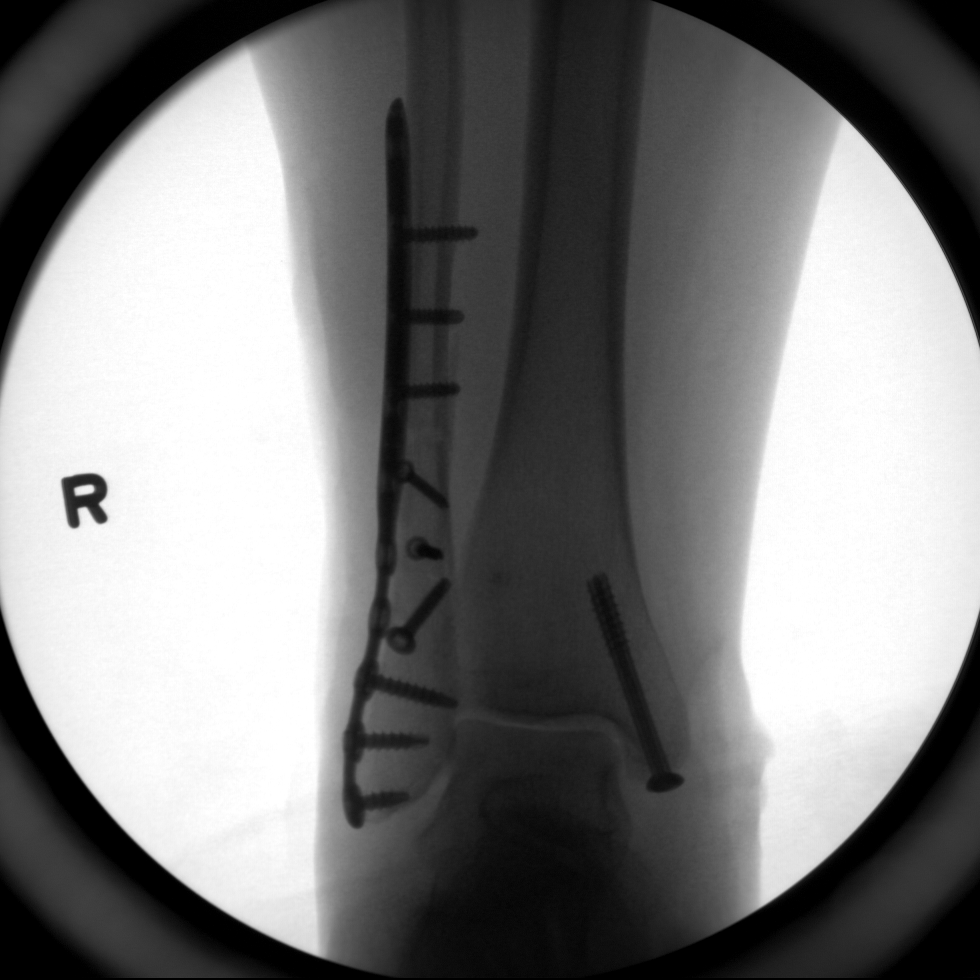

[4 of 4 positions shown; findings below may reference images not displayed]

FINDINGS: Right fibular fracture reduce with sideplate and screws.

Medial malleolar fracture reduced with 2 screws.

Satisfactory alignment of fracture fragments. Ankle mortise is
maintained.
IMPRESSION: Open reduction and internal fixation of right ankle fracture as
noted above.

## 2016-07-02 ENCOUNTER — Other Ambulatory Visit (INDEPENDENT_AMBULATORY_CARE_PROVIDER_SITE_OTHER): Payer: Self-pay | Admitting: Otolaryngology

## 2016-07-02 DIAGNOSIS — H903 Sensorineural hearing loss, bilateral: Secondary | ICD-10-CM
